# Patient Record
Sex: Male | Born: 1975 | Race: White | Hispanic: No | Marital: Married | Smoking: Never smoker
Health system: Southern US, Community
[De-identification: ages and names within clinical notes are randomized; demographics above are authoritative.]

---

## 2003-05-15 ENCOUNTER — Inpatient Hospital Stay (HOSPITAL_COMMUNITY): Admission: EM | Admit: 2003-05-15 | Discharge: 2003-05-16 | Payer: Self-pay | Admitting: Emergency Medicine

## 2019-04-06 ENCOUNTER — Emergency Department (HOSPITAL_COMMUNITY): Payer: Commercial Managed Care - PPO

## 2019-04-06 ENCOUNTER — Other Ambulatory Visit: Payer: Self-pay

## 2019-04-06 ENCOUNTER — Emergency Department (HOSPITAL_COMMUNITY)
Admission: EM | Admit: 2019-04-06 | Discharge: 2019-04-06 | Disposition: A | Discharge status: Home / Self Care | Payer: Commercial Managed Care - PPO | Attending: Emergency Medicine | Admitting: Emergency Medicine

## 2019-04-06 ENCOUNTER — Encounter (HOSPITAL_COMMUNITY): Payer: Self-pay

## 2019-04-06 DIAGNOSIS — T148XXA Other injury of unspecified body region, initial encounter: Secondary | ICD-10-CM

## 2019-04-06 DIAGNOSIS — L089 Local infection of the skin and subcutaneous tissue, unspecified: Secondary | ICD-10-CM | POA: Insufficient documentation

## 2019-04-06 DIAGNOSIS — Z79899 Other long term (current) drug therapy: Secondary | ICD-10-CM | POA: Diagnosis not present

## 2019-04-06 LAB — BASIC METABOLIC PANEL
Anion gap: 10 (ref 5–15)
BUN: 16 mg/dL (ref 6–20)
CO2: 28 mmol/L (ref 22–32)
Calcium: 9.3 mg/dL (ref 8.9–10.3)
Chloride: 103 mmol/L (ref 98–111)
Creatinine, Ser: 0.81 mg/dL (ref 0.61–1.24)
GFR calc Af Amer: >60 mL/min (ref >60)
GFR calc non Af Amer: >60 mL/min (ref >60)
Glucose, Bld: 92 mg/dL (ref 70–99)
Potassium: 4.1 mmol/L (ref 3.5–5.1)
Sodium: 141 mmol/L (ref 135–145)

## 2019-04-06 LAB — CBC WITH DIFFERENTIAL/PLATELET
Abs Immature Granulocytes: 0.05 10*3/uL (ref 0.00–0.07)
Basophils Absolute: 0.1 10*3/uL (ref 0.0–0.1)
Basophils Relative: 1 %
Eosinophils Absolute: 0.1 10*3/uL (ref 0.0–0.5)
Eosinophils Relative: 1 %
HCT: 39.3 % (ref 39.0–52.0)
Hemoglobin: 12.6 g/dL — ABNORMAL LOW (ref 13.0–17.0)
Immature Granulocytes: 1 %
Lymphocytes Relative: 20 %
Lymphs Abs: 1.9 10*3/uL (ref 0.7–4.0)
MCH: 27.3 pg (ref 26.0–34.0)
MCHC: 32.1 g/dL (ref 30.0–36.0)
MCV: 85.1 fL (ref 80.0–100.0)
Monocytes Absolute: 0.4 10*3/uL (ref 0.1–1.0)
Monocytes Relative: 5 %
Neutro Abs: 7.1 10*3/uL (ref 1.7–7.7)
Neutrophils Relative %: 72 %
Platelets: 274 10*3/uL (ref 150–400)
RBC: 4.62 MIL/uL (ref 4.22–5.81)
RDW: 13.3 % (ref 11.5–15.5)
WBC: 9.7 10*3/uL (ref 4.0–10.5)
nRBC: 0 % (ref 0.0–0.2)

## 2019-04-06 MED ORDER — DOXYCYCLINE HYCLATE 100 MG PO TABS: 100.0000 mg | ORAL_TABLET | Freq: Two times a day (BID) | ORAL | 0 refills | Status: AC

## 2019-04-06 NOTE — ED Provider Notes (Signed)
[facility]-EMERGENCY DEPT Provider Note   CSN: [id number] Arrival date & time: 04/06/19  2297     History Chief Complaint  Patient presents with  . thumb infection    Connor Robbins. is a 44 y.o. male.  HPI   Patient presents to the emergency room for evaluation of wound drainage.  Patient has a complicated history of an injury to his left hand approximately 16 years ago.  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  Patient states over the last several weeks he has been having issues with an infection.  Patient has had a complex course of receiving antibiotics.  He was on IV antibiotics at [facility].  Patient states he also had an MRI.  Patient started noticing some drainage from his thumb again.  Patient called his doctor and they tried to get him an appointment with an orthopedic hand surgeon.  He was instructed to come to the ED.  Patient denies any fevers or chills.  He states he has expressed about several thimble full of pus from the wound  History reviewed. No pertinent past medical history.  There are no problems to display for this patient.   Past Surgical History:  Procedure Laterality Date  . HAND SURGERY    . HERNIA REPAIR         Family History  Problem Relation Age of Onset  . Hypertension Father     Social History   Tobacco Use  . Smoking status: Never Smoker  . Smokeless tobacco: Never Used  Substance Use Topics  . Alcohol use: Never  . Drug use: Never    Home Medications Prior to Admission medications   Medication Sig Start Date End Date Taking? Authorizing Provider  lisinopril-hydrochlorothiazide (ZESTORETIC) 10-12.5 MG tablet Take 1 tablet by mouth daily. 03/31/19  Yes [provider]  loratadine (CLARITIN) 10 MG tablet Take 10 mg by mouth daily.   Yes [provider]  doxycycline (VIBRA-TABS) 100 MG tablet Take 1 tablet (100 mg total) by mouth 2 (two) times daily. 04/06/19    Linwood Dibbles, MD    Allergies    Patient has no known allergies.  Review of Systems   Review of Systems  All other systems reviewed and are negative.   Physical Exam Updated Vital Signs BP (!) 160/87   Pulse 79   Temp 97.8 F (36.6 C) (Oral)   Resp 19   Ht 1.702 m (5\' 7" )   Wt 136.1 kg   SpO2 97%   BMI 46.99 kg/m   Physical Exam Vitals and nursing note reviewed.  Constitutional:      General: He is not in acute distress.    Appearance: He is well-developed.  HENT:     Head: Normocephalic and atraumatic.     Right Ear: External ear normal.     Left Ear: External ear normal.  Eyes:     General: No scleral icterus.       Right eye: No discharge.        Left eye: No discharge.     Conjunctiva/sclera: Conjunctivae normal.  Neck:     Trachea: No tracheal deviation.  Cardiovascular:     Rate and Rhythm: Normal rate.  Pulmonary:     Effort: Pulmonary effort is normal. No respiratory distress.     Breath sounds: No stridor.  Abdominal:     General: There is no distension.  Musculoskeletal:        General:  No swelling, tenderness or deformity.     Cervical back: Neck supple.     Comments: Old complex injury left hand, small amount of purulent drainage expressed from a punctate wound on the left thumb, no surrounding erythema or fluctuance  Skin:    General: Skin is warm and dry.     Findings: No rash.  Neurological:     Mental Status: He is alert.     Cranial Nerves: Cranial nerve deficit: no gross deficits.       ED Results / Procedures / Treatments   Labs (all labs ordered are listed, but only abnormal results are displayed) Labs Reviewed  CBC WITH DIFFERENTIAL/PLATELET - Abnormal; Notable for the following components:      Result Value   Hemoglobin 12.6 (*)    All other components within normal limits  AEROBIC CULTURE (SUPERFICIAL SPECIMEN)  BASIC METABOLIC PANEL    EKG None  Radiology DG Hand Complete Left  Result Date: 04/06/2019 CLINICAL  DATA:  Left thumb draining purulence drainage. EXAM: LEFT HAND - COMPLETE 3+ VIEW COMPARISON:  May 14, 2013 FINDINGS: Normal visualized carpal bones. Normal first metacarpus. Normal second through fifth metacarpi. A chronic deformity is seen involving the distal phalanx of the left thumb. 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 Elongated radiopaque pins are seen within the proximal and distal phalanges of the third and fourth left fingers. Normal carpal articulations. Normal carpometacarpal (CMC) articulation of the left thumb. Early degenerative changes seen involving the metacarpophalangeal (MCP) joint and interphalangeal joint of the left thumb. Normal second through fifth carpometacarpal (CMC) joints. Normal second through fifth metacarpophalangeal (MCP) joints. Normal proximal interphalangeal (PIP) and distal interphalangeal (DIP) joints of the second through fifth fingers. Marked severity soft tissue swelling is seen surrounding the left thumb. No soft tissue air is identified. Numerous radiopaque surgical clips are seen scattered throughout the left hand. IMPRESSION: 1. Extensive chronic and postoperative changes involving the first, second, third and fourth left fingers, as described above. 2. Marked severity soft tissue swelling involving the left thumb. Electronically Signed   By: Virgina Norfolk M.D.   On: 04/06/2019 16:21    Procedures Procedures (including critical care time)  Medications Ordered in ED Medications - No data to display  ED Course  I have reviewed the triage vital signs and the nursing notes.  Pertinent labs & imaging results that were available during my care of the patient were reviewed by me and considered in my medical decision making (see chart for details).    MDM Rules/Calculators/A&P                      Patient's labs are reassuring.  X-ray show his chronic postoperative changes but he does have  swelling of the thumb.  Unclear what portion of this is acute versus old.  Patient does not have any significant erythema of the skin.  He clearly has a draining wound but no signs of systemic infection.  This is a complex case considering his prior surgeries.  Discussed the case with Dr. Apolonio Schneiders.  I will start the patient on oral antibiotics.  The patient will follow up with him in the office this week for further evaluation. Final Clinical Impression(s) / ED Diagnoses Final diagnoses:  Wound infection    Rx / DC Orders ED Discharge Orders         Ordered    doxycycline (VIBRA-TABS) 100 MG tablet  2 times daily     04/06/19 1705           Linwood Dibbles, MD 04/06/19 [phone]

## 2019-04-06 NOTE — ED Triage Notes (Signed)
Patient reports left thumb draining purulent drainage. Patient has been on IV antibiotics for an infection. Patient is not currently on any antibiotics.

## 2019-04-06 NOTE — Discharge Instructions (Signed)
Take the antibiotics as prescribed, follow-up with Dr. Orlan Leavens.  He would like to see you in the office on Friday.  Call to schedule the appointment

## 2019-04-09 LAB — AEROBIC CULTURE W GRAM STAIN (SUPERFICIAL SPECIMEN)

## 2019-04-09 LAB — AEROBIC CULTURE? (SUPERFICIAL SPECIMEN)

## 2020-05-06 IMAGING — CR DG HAND COMPLETE 3+V*L*
3 series · 3 of 3 positions shown · non-contrast
Comparison: May 14, 2013

CLINICAL DATA: Left thumb draining purulence drainage.

EXAM:
LEFT HAND - COMPLETE 3+ VIEW

[x hand pa left]
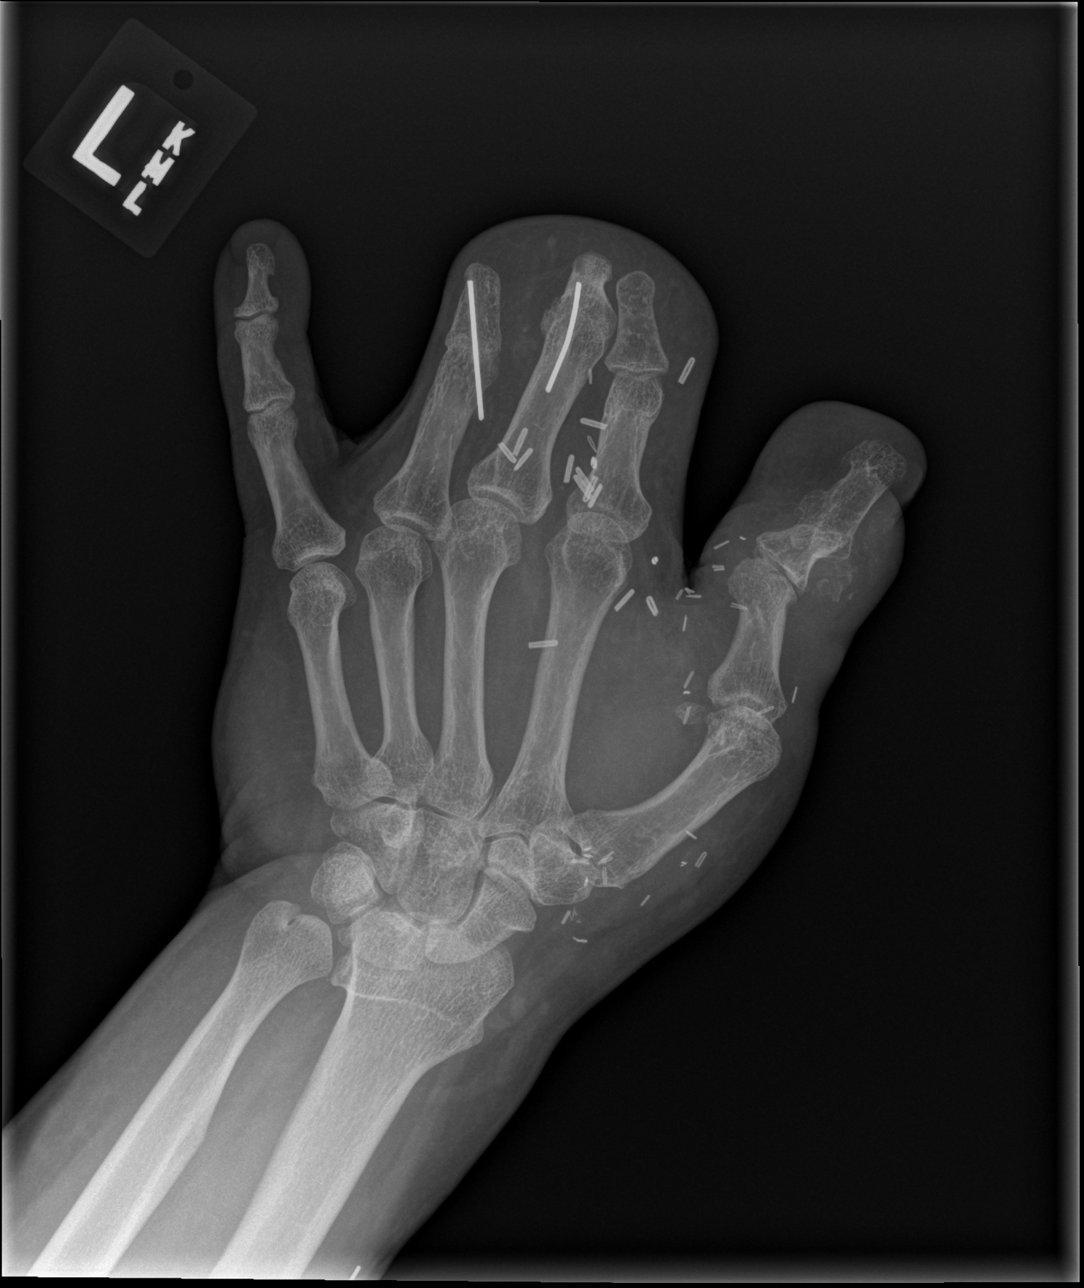

[x hand obl left]
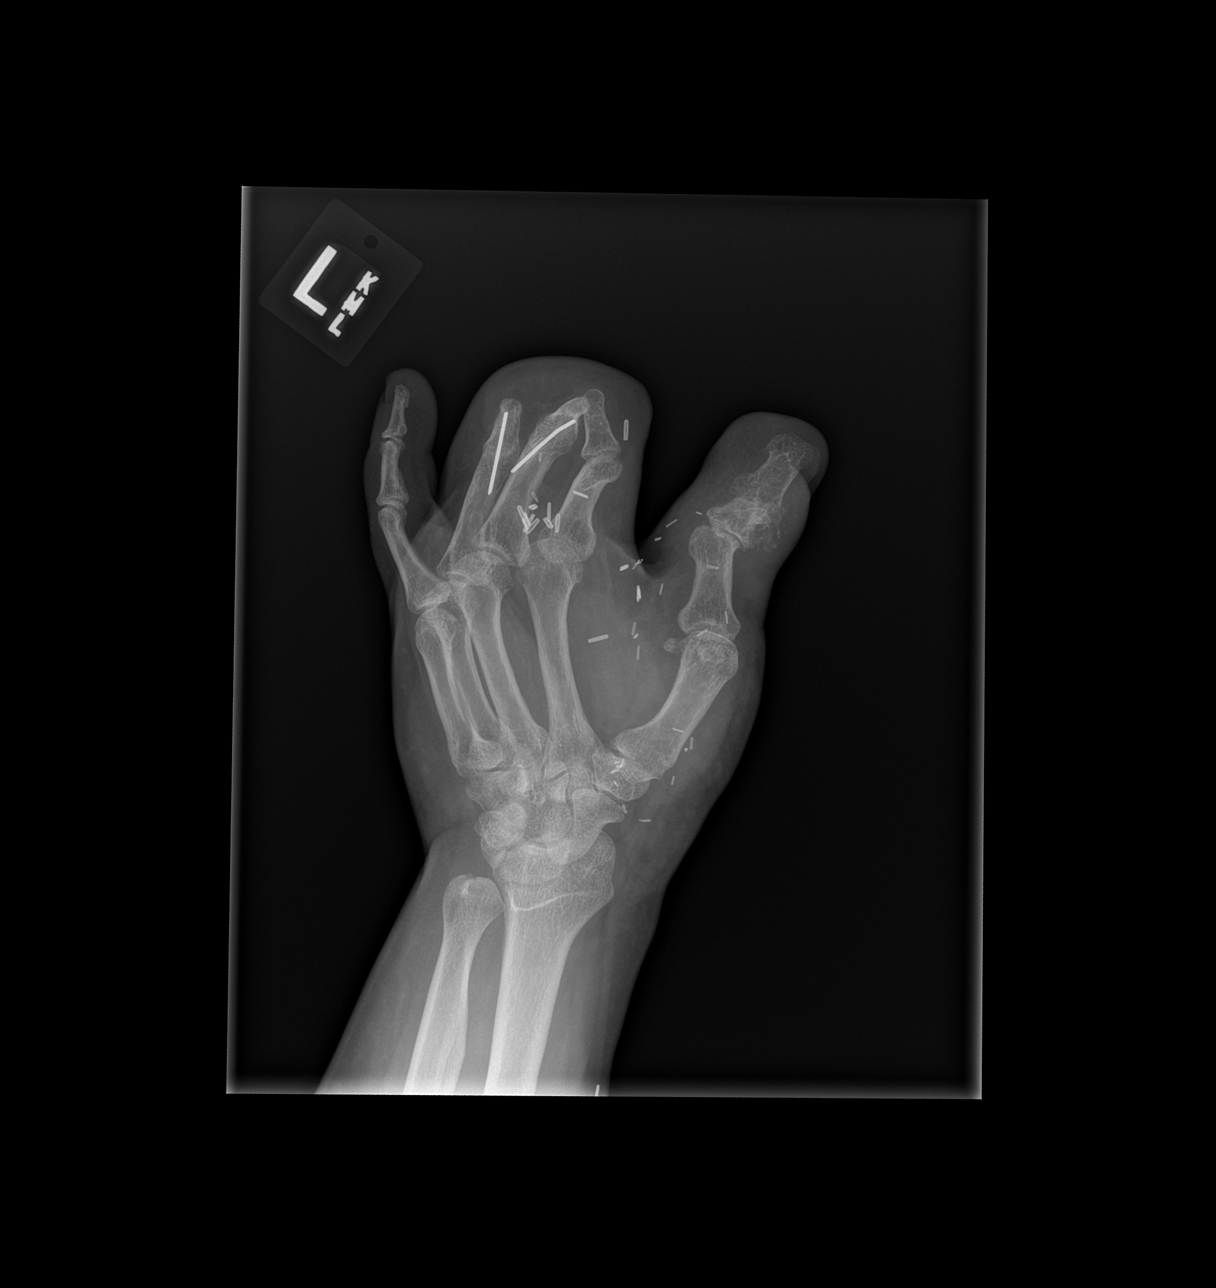

[x hand lat left]
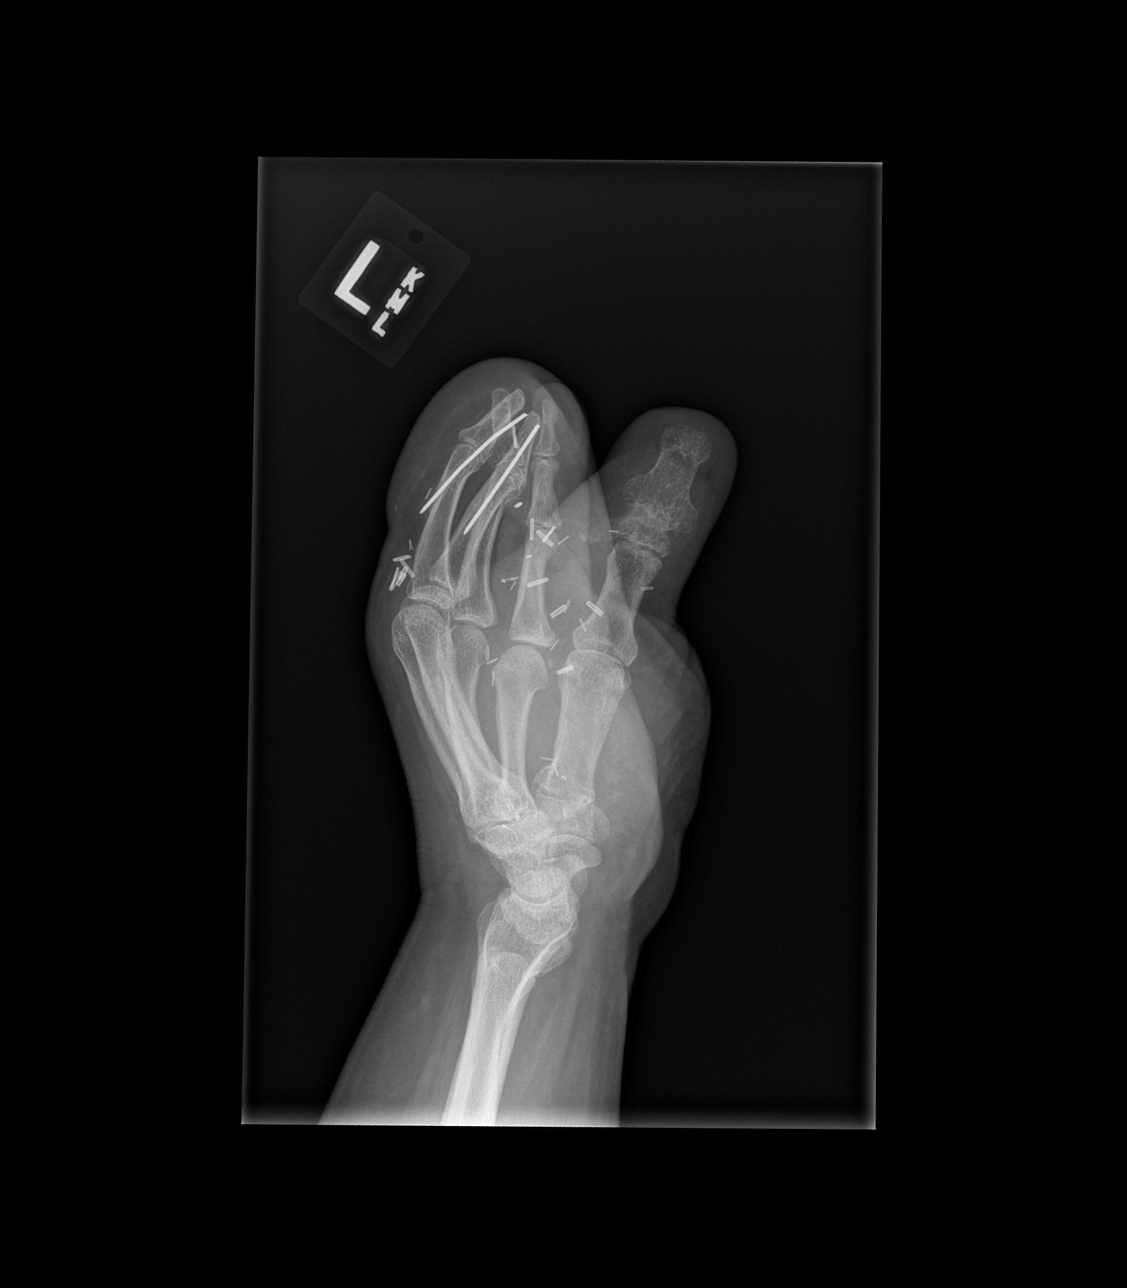

[3 of 3 positions shown; findings below may reference images not displayed]

FINDINGS: Normal visualized carpal bones. Normal first metacarpus. Normal
second through fifth metacarpi.

A chronic deformity is seen involving the distal phalanx of the left
thumb.

Surgical amputation of the distal phalanges of the second, third and
fourth left fingers is seen.

Elongated radiopaque pins are seen within the proximal and distal
phalanges of the third and fourth left fingers.
Normal carpal articulations.
Normal carpometacarpal (CMC) articulation of the left thumb. Early
degenerative changes seen involving the metacarpophalangeal (MCP)
joint and interphalangeal joint of the left thumb.
Normal second through fifth carpometacarpal (CMC) joints. Normal
second through fifth metacarpophalangeal (MCP) joints. Normal
proximal interphalangeal (PIP) and distal interphalangeal (DIP)
joints of the second through fifth fingers.

Marked severity soft tissue swelling is seen surrounding the left
thumb. No soft tissue air is identified.

Numerous radiopaque surgical clips are seen scattered throughout the
left hand.
IMPRESSION: 1. Extensive chronic and postoperative changes involving the first,
second, third and fourth left fingers, as described above.
2. Marked severity soft tissue swelling involving the left thumb.
